# Patient Record
Sex: Female | Born: 1990 | Race: White | Hispanic: No | Marital: Married | Smoking: Never smoker
Health system: Southern US, Community
[De-identification: ages and names within clinical notes are randomized; demographics above are authoritative.]

## PROBLEM LIST (undated history)

## (undated) HISTORY — PX: WISDOM TOOTH EXTRACTION: SHX21

---

## 2016-09-11 ENCOUNTER — Inpatient Hospital Stay (HOSPITAL_COMMUNITY)
Admission: AD | Admit: 2016-09-11 | Discharge: 2016-09-11 | Disposition: A | Discharge status: Home / Self Care | Payer: Self-pay | Source: Ambulatory Visit | Attending: Obstetrics and Gynecology | Admitting: Obstetrics and Gynecology

## 2016-09-11 DIAGNOSIS — N912 Amenorrhea, unspecified: Secondary | ICD-10-CM | POA: Insufficient documentation

## 2016-09-11 DIAGNOSIS — Z3202 Encounter for pregnancy test, result negative: Secondary | ICD-10-CM | POA: Insufficient documentation

## 2016-09-11 DIAGNOSIS — N911 Secondary amenorrhea: Secondary | ICD-10-CM

## 2016-09-11 LAB — POCT PREGNANCY, URINE: PREG TEST UR: NEGATIVE

## 2016-09-11 LAB — URINALYSIS, ROUTINE W REFLEX MICROSCOPIC
Bilirubin Urine: NEGATIVE
Glucose, UA: NEGATIVE mg/dL
HGB URINE DIPSTICK: NEGATIVE
KETONES UR: NEGATIVE mg/dL
Leukocytes, UA: NEGATIVE
NITRITE: NEGATIVE
PROTEIN: NEGATIVE mg/dL
Specific Gravity, Urine: 1.014 (ref 1.005–1.030)
pH: 7 (ref 5.0–8.0)

## 2016-09-11 NOTE — Discharge Instructions (Signed)
Secondary Amenorrhea Secondary amenorrhea is the stopping of menstrual flow for 3-6 months in a female who has previously had periods. There are many possible causes. Most of these causes are not serious. Usually, treating the underlying problem causing the loss of menses will return your periods to normal. What are the causes? Some common and uncommon causes of not menstruating include:  Malnutrition.  Low blood sugar (hypoglycemia).  Polycystic ovary disease.  Stress or fear.  Breastfeeding.  Hormone imbalance.  Ovarian failure.  Medicines.  Extreme obesity.  Cystic fibrosis.  Low body weight or drastic weight reduction from any cause.  Early menopause.  Removal of ovaries or uterus.  Contraceptives.  Illness.  Long-term (chronic) illnesses.  Cushing syndrome.  Thyroid problems.  Birth control pills, patches, or vaginal rings for birth control.  What increases the risk? You may be at greater risk of secondary amenorrhea if:  You have a family history of this condition.  You have an eating disorder.  You do athletic training.  How is this diagnosed? A diagnosis is made by your health care provider taking a medical history and doing a physical exam. This will include a pelvic exam to check for problems with your reproductive organs. Pregnancy must be ruled out. Often, numerous blood tests are done to measure different hormones in the body. Urine testing may be done. Specialized exams (ultrasound, CT scan, MRI, or hysteroscopy) may have to be done as well as measuring the body mass index (BMI). How is this treated? Treatment depends on the cause of the amenorrhea. If an eating disorder is present, this can be treated with an adequate diet and therapy. Chronic illnesses may improve with treatment of the illness. Amenorrhea may be corrected with medicines, lifestyle changes, or surgery. If the amenorrhea cannot be corrected, it is sometimes possible to create a  false menstruation with medicines. Follow these instructions at home:  Maintain a healthy diet.  Manage weight problems.  Exercise regularly but not excessively.  Get adequate sleep.  Manage stress.  Be aware of changes in your menstrual cycle. Keep a record of when your periods occur. Note the date your period starts, how long it lasts, and any problems. Contact a health care provider if: Your symptoms do not get better with treatment. This information is not intended to replace advice given to you by your health care provider. Make sure you discuss any questions you have with your health care provider. Document Released: 02/23/2006 Document Revised: 06/20/2015 Document Reviewed: 06/30/2012 Elsevier Interactive Patient Education  2018 Elsevier Inc.  

## 2016-09-11 NOTE — MAU Provider Note (Signed)
  History     CSN: [id number]  Arrival date and time: 09/11/16 1359   None     No chief complaint on file.  HPI 26 yo G0 with LMP 04/2016 presenting today for evaluation of amenorrhea. Patient was seen at pregnancy care center today for pregnancy test and ultrasound. Pregnancy test was found to be negative. Patient denies any pelvic/abdominal pain. She denies any abnormal discharge. Patient denies any abnormal bleeding  OB History    No data available      No past medical history on file.  No past surgical history on file.  No family history on file.  Social History  Substance Use Topics  . Smoking status: Not on file  . Smokeless tobacco: Not on file  . Alcohol use Not on file    Allergies: Allergies not on file  No prescriptions prior to admission.    Review of Systems  See pertinent in HPI Physical Exam   Blood pressure 125/74, pulse 80, temperature 98.4 F (36.9 C), resp. rate 18, height 5\' 4"  (1.626 m), weight 209 lb (94.8 kg), last menstrual period 04/30/2016.  Physical Exam GENERAL: Well-developed, well-nourished female in no acute distress.  ABDOMEN: Soft, nontender, nondistended.  PELVIC: Not indicated EXTREMITIES: No cyanosis, clubbing, or edema, 2+ distal pulses.  MAU Course  Procedures  MDM UPT- negative  Assessment and Plan  26 yo G0 with amnorrhea - Patient informed of negative UPT - Discussed different etiology for amenorrhea and need for outpatient evaluation - Outpatient ultrasound ordered - patient to follow up at [facility] for results and further management   Peggy Hartman 09/11/2016, 2:22 PM

## 2016-09-11 NOTE — MAU Note (Signed)
Pt presents to MAU after going to pregnancy care center today stating that she had her last menstrual cycle in April and assumed she was approximately [redacted] weeks pregnant and was told today her pregnancy test was negative. Pt denies any pain or VB

## 2016-09-15 ENCOUNTER — Telehealth: Payer: Self-pay | Admitting: General Practice

## 2016-09-15 NOTE — Telephone Encounter (Signed)
Called and notified patient of appointment with Dr. Jolayne Panther on 09/21/16 at 3:20pm.  Patient voiced understanding.

## 2016-09-18 ENCOUNTER — Encounter (HOSPITAL_COMMUNITY): Payer: Self-pay

## 2016-09-18 ENCOUNTER — Inpatient Hospital Stay (HOSPITAL_COMMUNITY)
Admission: AD | Admit: 2016-09-18 | Discharge: 2016-09-18 | Disposition: A | Discharge status: Home / Self Care | Payer: Self-pay | Source: Ambulatory Visit | Attending: Family Medicine | Admitting: Family Medicine

## 2016-09-18 ENCOUNTER — Ambulatory Visit (HOSPITAL_COMMUNITY): Admission: RE | Admit: 2016-09-18 | Payer: Self-pay | Source: Ambulatory Visit

## 2016-09-18 ENCOUNTER — Inpatient Hospital Stay (HOSPITAL_COMMUNITY): Payer: Self-pay

## 2016-09-18 DIAGNOSIS — R112 Nausea with vomiting, unspecified: Secondary | ICD-10-CM

## 2016-09-18 DIAGNOSIS — R102 Pelvic and perineal pain unspecified side: Secondary | ICD-10-CM

## 2016-09-18 DIAGNOSIS — Z880 Allergy status to penicillin: Secondary | ICD-10-CM | POA: Insufficient documentation

## 2016-09-18 DIAGNOSIS — Z885 Allergy status to narcotic agent status: Secondary | ICD-10-CM | POA: Insufficient documentation

## 2016-09-18 DIAGNOSIS — Z3202 Encounter for pregnancy test, result negative: Secondary | ICD-10-CM

## 2016-09-18 DIAGNOSIS — N911 Secondary amenorrhea: Secondary | ICD-10-CM

## 2016-09-18 DIAGNOSIS — R109 Unspecified abdominal pain: Secondary | ICD-10-CM

## 2016-09-18 DIAGNOSIS — Z91010 Allergy to peanuts: Secondary | ICD-10-CM | POA: Insufficient documentation

## 2016-09-18 DIAGNOSIS — Z91018 Allergy to other foods: Secondary | ICD-10-CM | POA: Insufficient documentation

## 2016-09-18 LAB — CBC
HCT: 38.5 % (ref 36.0–46.0)
HEMOGLOBIN: 12.9 g/dL (ref 12.0–15.0)
MCH: 28.4 pg (ref 26.0–34.0)
MCHC: 33.5 g/dL (ref 30.0–36.0)
MCV: 84.8 fL (ref 78.0–100.0)
PLATELETS: 223 10*3/uL (ref 150–400)
RBC: 4.54 MIL/uL (ref 3.87–5.11)
RDW: 12.9 % (ref 11.5–15.5)
WBC: 5.8 10*3/uL (ref 4.0–10.5)

## 2016-09-18 LAB — COMPREHENSIVE METABOLIC PANEL
ALBUMIN: 3.9 g/dL (ref 3.5–5.0)
ALT: 22 U/L (ref 14–54)
AST: 20 U/L (ref 15–41)
Alkaline Phosphatase: 38 U/L (ref 38–126)
Anion gap: 9 (ref 5–15)
BUN: 9 mg/dL (ref 6–20)
CHLORIDE: 104 mmol/L (ref 101–111)
CO2: 25 mmol/L (ref 22–32)
CREATININE: 0.65 mg/dL (ref 0.44–1.00)
Calcium: 8.8 mg/dL — ABNORMAL LOW (ref 8.9–10.3)
GFR calc Af Amer: >60 mL/min (ref >60)
GFR calc non Af Amer: >60 mL/min (ref >60)
GLUCOSE: 109 mg/dL — AB (ref 65–99)
Potassium: 3.5 mmol/L (ref 3.5–5.1)
SODIUM: 138 mmol/L (ref 135–145)
Total Bilirubin: 0.5 mg/dL (ref 0.3–1.2)
Total Protein: 7.4 g/dL (ref 6.5–8.1)

## 2016-09-18 LAB — URINALYSIS, ROUTINE W REFLEX MICROSCOPIC
BILIRUBIN URINE: NEGATIVE
Glucose, UA: NEGATIVE mg/dL
Hgb urine dipstick: NEGATIVE
KETONES UR: NEGATIVE mg/dL
LEUKOCYTES UA: NEGATIVE
Nitrite: NEGATIVE
Protein, ur: NEGATIVE mg/dL
Specific Gravity, Urine: 1.002 — ABNORMAL LOW (ref 1.005–1.030)
pH: 7 (ref 5.0–8.0)

## 2016-09-18 LAB — TSH: TSH: 2.537 u[IU]/mL (ref 0.350–4.500)

## 2016-09-18 LAB — HCG, SERUM, QUALITATIVE: Preg, Serum: NEGATIVE

## 2016-09-18 MED ORDER — METOCLOPRAMIDE HCL 10 MG PO TABS: 5.0000 mg | ORAL_TABLET | Freq: Three times a day (TID) | ORAL | 2 refills | Status: AC

## 2016-09-18 MED ORDER — OMEPRAZOLE 20 MG PO CPDR: 20.0000 mg | DELAYED_RELEASE_CAPSULE | Freq: Every day | ORAL | 5 refills | Status: AC

## 2016-09-18 MED ORDER — MEDROXYPROGESTERONE ACETATE 10 MG PO TABS: 10.0000 mg | ORAL_TABLET | Freq: Every day | ORAL | 2 refills | Status: AC

## 2016-09-18 NOTE — MAU Note (Signed)
Pt presents to MAU stating that she was scheduled for any U/S after being evaluated last week for LMP of 04/05/1/. PT states she was suppose to follow up with Dr Jolayne Panther on Monday August the 27th for ultrasound results. PT states she went to U/S today and was told her U/S was cancelled

## 2016-09-18 NOTE — Discharge Instructions (Signed)
[place] Resource Guide °(Revised August 2014) ° ° °Chronic Pain Problems:  °• [facility]:  [phone]  °         Patients need to be referred by their primary care doctor/specialist ° °Insufficient Money for Medicine:  °·         United Way: call "211"   °• MAP Program at [facility] [place] 641-8030 or [place] 641-7620 °          ° °No Primary Care Doctor:  °To locate a primary care doctor that accepts your insurance or provides certain services:  °·         [facility]: 832-8000  °·         Physician Referral Service: 1-800-533-3463 ask for “My [place]” °• If no insurance, you need to see if you qualify for GCCN “orange card”, call to set      up appointment for eligibility/enrollment at [phone] or [phone] or visit [place] [facility] ([address], [place] and [address] -[place]) to meet with a GCCN enrollment specialist. ° °Agencies that provide inexpensive (sliding fee scale) medical care:  ° °•    [facility] - [facility] at [place] - [phone] °•    [facility]  -  [facility] - [phone] °•    [facility] - 336-832-7272 °•    [facility] - 336-832-4444 °•    [facility] - 336-832-3150 °•    [facility] - 336-832-8035 °• [facility] - [facility] [place] - 336-272-     1050 °• [facility] - [facility] [place] - 336-370-9091 °• [facility]: 336-832-8035  °• [facility]: 336-832-4777  °• [facility]: 336-373-0678  °• [facility] - 336- 387-6161 ° ° ° °Medicaid-accepting [place] Providers:  °·         [facility] - 641-2100 (No Family Planning accepted) °·         [address], 641-2100, Mon-Fri 9am-5pm °·          [facility] - [phone] °• [address], [address], Mon-Thursday 8am-5pm, Fri 8am-noon °• [facility] - [phone] °·         [address], [address], Mon-Fri 7:30am-4:30pm °·         [facility] - 299-7000 °·         [address] [place] [address], Mon-Fri 8am-5pm  °·        [facility] - [phone] °·         [address] °·         Only accepts [place] Access Medicaid patients after they have their name applied to their card ° °Self Pay (no insurance) in [place]:  °         Sickle Cell Patients:  °• [address], (336) 832-1970 °[facility]: °• [address], [place] (336) 832-7272 ° °     [facility] °• [address], [place] (336) 832-4444 ° ° Family Practice: °• [address], (336) [phone] °           [facility]  °·         [address], (336) 832-4400 °[facility] °• [address], (336) [phone] °          [facility]  °·         [address], [place] [phone] °       [facility] - [address]  °·         641-2100, Mon-Fri 9am-7pm, Sat 9am-1pm °         [facility] [place] °·         [address] [place] [address], [phone] °         [facility] [place]  °·         [address], [phone] °[facility] - [facility] [place] °• [address], [place] (336) 884-0224 °         [facility]  °·         [address] [place] [address], [phone] ° [facility]  °• [address], (336) 272-1050 °[facility] °• [address], (336) 370-9091 ° Dr. Osei-Bonsu  °·         [address], [place],  [phone] °         [facility]  °·         [address], [phone] °         [facility]  °·           [address], [phone] °         [facility]  °·         [address], [phone], 1st & 3rd Saturday every month, 10am-1pm ° °OTHERS:  [facility]  ([facility] Only)  (336) [phone] (Thursday only) ° °Strategies for finding a Primary Care Provider:  °1) Find a Doctor and Pay Out of Pocket  °Although you won't have to find out who is covered by your insurance plan, it is a good idea to ask around and get recommendations. You will then need to call the office and see if the doctor you have chosen will accept you as a new patient and what types of options they offer for patients who are self-pay. Some doctors offer discounts or will set up payment plans for their patients who do not have insurance, but you will need to ask so you aren't surprised when you get to your appointment.  °2) Contact [facility] - To see if you qualify for “orange card” access to healthcare safety net providers.  Call for appointment for eligibility/enrollment at [phone] or [phone]- [phone]. (Uninsured, 0-200% FPL, qualifying info)  °Applicants for GCCN are first required to see if they are eligible to enroll in the ACA Marketplace before enrolling in GCCN (and get an exemption if they are not).  °GCCN Criteria for acceptance is:  °? Proof of ACA Marketing exemption - form or documentation  °? Valid photo ID (driver's license, state identification card, passport, home country ID)  °? Proof of [place] residency (e.g. driver’s license, lease/landlord information, pay stubs with address, utility   bill, bank statement, etc.)  °? Proof of income (1040, last year's tax return, W2, 4 current pay stubs, other income proof)  °? Proof of assets (current bank statement + 3 most recent, disability paperwork, life insurance info, tax value on autos, etc.) ° °3)  Contact Your Local Health Department  °Not all health departments have doctors that can see patients for sick visits, but many do, so it is worth a call to see if yours does. If you don't know where your local health department is, you can check in your phone book. The CDC also has a tool to help you locate your state's health department, and many state websites also have listings of all of their local health departments.  °4) Find a Walk-in Clinic  °If your illness is not likely to be very severe or complicated, you may want to try a walk in clinic. These are popping up all over the country in pharmacies, drugstores, and shopping centers. They're usually staffed by nurse practitioners or physician assistants that have been trained to treat common illnesses and complaints. They're usually fairly quick and inexpensive. However, if you have serious medical issues or chronic medical problems, these are probably not your best option  ° °STD Testing:  °·         [place] [facility] [place], STD Clinic  °·         [address], [place], phone [phone] or [phone]  °·         Monday - Friday, call for an appointment °·         [facility] [place], STD Clinic  °·         [address], [place], phone [phone] or [phone]  °·         Monday - Friday, call for an appointment °Abuse/Neglect:  °·         [place] Child Abuse Hotline: 641-3795  °·         [place] Child Abuse Hotline: 800-378-5315 (After Hours) ° °Emergency Shelter:  °[facility] (336) 271-5959 ° [facility] [place]- (336) 881-5415 ° [facility] [place] - (336) 235-0330 ° [facility] - (336) 375-1332 (ages 11-17) ° Homeless Day Shelter @ [facility] - (336) 332-0824 °  °Mammograms - Free at [facility] - [place] - 614-3233 ° °Maternity Homes:  °·         [facility]: (336) 275-9566   (Homeless mother with  children) °·         [facility]: (704) 372-4663 (Mothers only) °• [facility]: (336) 370-9232 (Pregnant 16-21 years old) °• [facility] -(336) 641-7777 ° °[place] [facility]  °• [facility] °• [address], [place] (336) 355-9701 °·         [facility] [place]  °·         [address], [place]  °·         349-3220 °·         [employer]  °·         [address], [place]  °·         342-7768 °·         [facility]  °·         [address], [place]  °·         342-8140 °·         [facility]  °·         342-8316 °·         [place] [facility] - [facility]  °·         1-888-581-9988 °·         [facility] in [place]  °·         [address]  °·         336-349-4454, Insurance °·         [place] Child Abuse Hotline  °·         (336) 342-1394  °·         (336) 342-3537 (After Hours) ° °Behavioral Health Services /Substance Abuse Resources:  °·         [facility]: 882-2125  °·         [facility]: 784-9470  °·        [facility] [place] [facility]: (336) 285-9073  °• Narcotics Helpline - 1-866-375-1272 °·         [facility]: (336) 845-3988  °·         [facility] Outpatient Substance Abuse [facility] - [facility]: 800-659-3381 °• [facility] - (336) 285-2605 °Psychological Services: °·         [facility]: 832-9600  °• [facility]: 1-877-626-1772 °·         [facility]  °·         [address] [place] [address], [place]  °·         ACCESS LINE: 1-800-256-2452     (24 Hour) °• Mobile Crisis:  °• HELPLINES:  °[facility] [place] (336) 370-4264 °[facility] - [place] (800) 451-9682 °• Walk In Crisis Services °      [facility] - [address] [place] [address] - [place]  (336)676-6905 °       [facility] - (336)832-9600 or (336) 832-9700 ° [facility] - [address] - [place] (336)899-1505 ° [facility] [place] [facility] - [address], [place] (336) 878-6098 ° ° °Dental Assistance:  °If unable to pay or uninsured, contact: [place] [facility] to become qualified for the adult dental clinic. Patient must be enrolled in GCCN (uninsured, 0-200% FPL, qualifying info).  Enroll in GCCN first, then see Primary Care Physician assigned to you, the PCP makes a dental referral. [facility] will receive referral and contacts patient for appointment. ° °Patients with Medicaid  °         [address], 510-2600 ° [facility]Children up to 20 + Pregnant Women) - (336) 641-3152 ° [facility] - [address] - [address] (336) 235-2808 ° °If unable to pay, or uninsured: contact [place] [facility] (641-3152 in [place] - (Children only + Pregnant Women), 641-7733 in [place]- Children only) to become qualified for the adult dental clinic  °Must see if eligible to enroll in ACA Health Insurance Marketplace before enrolling into the GCCN (exemption required) (1-855-733-3711 for an appointment)  www.healthcare.gov;   1-800-318-2596.  If not eligible for ACA, then go by Department of Health and Human Services to see if eligible for “orange card.”    [address], [place] and [address]- [place].  Once you get an orange card, you will have a Primary Care home who will then refer you to dental if needed. ° ° ° ° ° ° ° °Other Low-Cost Community Dental Services:  ° [facility] - 334-4822 (ext 50251) °  [address] [place] [address] ° Dr. Civils - 272-4177 °  [address] °  ° [facility] - 734-7550 °  [address] ° °         [facility]  °         [address], [place], [place], [address]  °         723-1848, Ext. 123  °         2nd and 4th Thursday of the month at 6:30am (Simple  extractions only - no wisdom teeth or surgery) First come/First serve -First 10 clients served ° °         [facility] ([place] and [place] residents only) °         [address], [place], [place], [address]  °         723-7904 °          °         [facility]  °         342-8273 °         [place] [facility]  °        703-3100 °         [facility] °         570-6415 °  °Transportation Options: ° Ambulance - 911 - $250-$700 per ride °Family Member to accompany patient (if stable) - [place] [employer] - (336) 355-6499 ° PART - (336) 662-0002 ° Taxi - (336) 272-5112 - [place] ° SCAT - (336) 333-6589 (Application required) ° [place] [facility] - (336) 641-4848 °  ° °

## 2016-09-18 NOTE — MAU Provider Note (Signed)
Chief Complaint: Follow-up   First Provider Initiated Contact with Patient 09/18/16 0847      SUBJECTIVE HPI: Peggy Hartman is a 26 y.o. G1P0010 who presents to maternity admissions reporting no menses x 4 months, nausea and vomiting, and abdominal bloating/enlargement x 3 months. She is tearful, reporting the nausea is so severe that she cannot work and may lose her job.  She reports some mild abdominal cramping that has not required any treatment. There are no associated symptoms.  She denies vaginal bleeding, vaginal itching/burning, urinary symptoms, h/a, dizziness, n/v, or fever/chills.     HPI  No past medical history on file. Past Surgical History:  Procedure Laterality Date  . WISDOM TOOTH EXTRACTION     Social History   Social History  . Marital status: Married    Spouse name: N/A  . Number of children: N/A  . Years of education: N/A   Occupational History  . Not on file.   Social History Main Topics  . Smoking status: Never Smoker  . Smokeless tobacco: Never Used  . Alcohol use No  . Drug use: No  . Sexual activity: Yes   Other Topics Concern  . Not on file   Social History Narrative  . No narrative on file   No current facility-administered medications on file prior to encounter.    No current outpatient prescriptions on file prior to encounter.   Allergies  Allergen Reactions  . Other Anaphylaxis    Peanuts, tree nuts, coconut, tropical fruits, bananas  . Penicillins Hives    Has patient had a PCN reaction causing immediate rash, facial/tongue/throat swelling, SOB or lightheadedness with hypotension: Yes Has patient had a PCN reaction causing severe rash involving mucus membranes or skin necrosis: No Has patient had a PCN reaction that required hospitalization: No Has patient had a PCN reaction occurring within the last 10 years: Yes If all of the above answers are "NO", then may proceed with Cephalosporin use.   Marland Kitchen Percocet  [Oxycodone-Acetaminophen] Hives    ROS:  Review of Systems  Constitutional: Negative for chills, fatigue and fever.  Respiratory: Negative for shortness of breath.   Cardiovascular: Negative for chest pain.  Gastrointestinal: Positive for nausea and vomiting. Negative for constipation.  Genitourinary: Positive for pelvic pain. Negative for difficulty urinating, dysuria, flank pain, vaginal bleeding, vaginal discharge and vaginal pain.  Neurological: Negative for dizziness and headaches.  Psychiatric/Behavioral: Negative.      I have reviewed patient's Past Medical Hx, Surgical Hx, Family Hx, Social Hx, medications and allergies.   Physical Exam  Patient Vitals for the past 24 hrs:  BP Temp Pulse Resp  09/18/16 0831 (!) 142/66 98.2 F (36.8 C) 61 18   Constitutional: Well-developed, well-nourished female in no acute distress.  Cardiovascular: normal rate Respiratory: normal effort GI: Abd soft, non-tender. Pos BS x 4 MS: Extremities nontender, no edema, normal ROM Neurologic: Alert and oriented x 4.  GU: Neg CVAT.  PELVIC EXAM: Deferred   LAB RESULTS Results for orders placed or performed during the hospital encounter of 09/18/16 (from the past 24 hour(s))  Urinalysis, Routine w reflex microscopic     Status: Abnormal   Collection Time: 09/18/16  8:50 AM  Result Value Ref Range   Color, Urine STRAW (A) YELLOW   APPearance CLEAR CLEAR   Specific Gravity, Urine 1.002 (L) 1.005 - 1.030   pH 7.0 5.0 - 8.0   Glucose, UA NEGATIVE NEGATIVE mg/dL   Hgb urine dipstick NEGATIVE NEGATIVE  Bilirubin Urine NEGATIVE NEGATIVE   Ketones, ur NEGATIVE NEGATIVE mg/dL   Protein, ur NEGATIVE NEGATIVE mg/dL   Nitrite NEGATIVE NEGATIVE   Leukocytes, UA NEGATIVE NEGATIVE  CBC     Status: None   Collection Time: 09/18/16  9:18 AM  Result Value Ref Range   WBC 5.8 4.0 - 10.5 K/uL   RBC 4.54 3.87 - 5.11 MIL/uL   Hemoglobin 12.9 12.0 - 15.0 g/dL   HCT 16.1 09.6 - 04.5 %   MCV 84.8 78.0 -  100.0 fL   MCH 28.4 26.0 - 34.0 pg   MCHC 33.5 30.0 - 36.0 g/dL   RDW 40.9 81.1 - 91.4 %   Platelets 223 150 - 400 K/uL  Comprehensive metabolic panel     Status: Abnormal   Collection Time: 09/18/16  9:18 AM  Result Value Ref Range   Sodium 138 135 - 145 mmol/L   Potassium 3.5 3.5 - 5.1 mmol/L   Chloride 104 101 - 111 mmol/L   CO2 25 22 - 32 mmol/L   Glucose, Bld 109 (H) 65 - 99 mg/dL   BUN 9 6 - 20 mg/dL   Creatinine, Ser 7.82 0.44 - 1.00 mg/dL   Calcium 8.8 (L) 8.9 - 10.3 mg/dL   Total Protein 7.4 6.5 - 8.1 g/dL   Albumin 3.9 3.5 - 5.0 g/dL   AST 20 15 - 41 U/L   ALT 22 14 - 54 U/L   Alkaline Phosphatase 38 38 - 126 U/L   Total Bilirubin 0.5 0.3 - 1.2 mg/dL   GFR calc non Af Amer >60 >60 mL/min   GFR calc Af Amer >60 >60 mL/min   Anion gap 9 5 - 15  hCG, serum, qualitative     Status: None   Collection Time: 09/18/16  9:18 AM  Result Value Ref Range   Preg, Serum NEGATIVE NEGATIVE  TSH     Status: None   Collection Time: 09/18/16  9:18 AM  Result Value Ref Range   TSH 2.537 0.350 - 4.500 uIU/mL       IMAGING US Transvaginal Non-ob  Result Date: 09/18/2016 CLINICAL DATA:  Amenorrhea. EXAM: TRANSABDOMINAL AND TRANSVAGINAL ULTRASOUND OF PELVIS TECHNIQUE: Both transabdominal and transvaginal ultrasound examinations of the pelvis were performed. Transabdominal technique was performed for global imaging of the pelvis including uterus, ovaries, adnexal regions, and pelvic cul-de-sac. It was necessary to proceed with endovaginal exam following the transabdominal exam to visualize the endometrium. COMPARISON:  None FINDINGS: Uterus Measurements: 8 x 4.0 x 5.1 cm. No fibroids or other mass visualized. Endometrium Thickness: 7.6 mm.  No focal abnormality visualized. Right ovary Measurements: 2.3 x 1.8 x 2.0 cm. Normal appearance/no adnexal mass. Left ovary Measurements: 3.8 x 2.2 x 2.4 cm. Corpus luteal cysts noted. Other findings No abnormal free fluid. IMPRESSION: 1. Normal  physiologic appearance of the uterus and adnexal structures. Electronically Signed   By: Signa Kell M.D.   On: 09/18/2016 11:22   US Pelvis Complete  Result Date: 09/18/2016 CLINICAL DATA:  Amenorrhea. EXAM: TRANSABDOMINAL AND TRANSVAGINAL ULTRASOUND OF PELVIS TECHNIQUE: Both transabdominal and transvaginal ultrasound examinations of the pelvis were performed. Transabdominal technique was performed for global imaging of the pelvis including uterus, ovaries, adnexal regions, and pelvic cul-de-sac. It was necessary to proceed with endovaginal exam following the transabdominal exam to visualize the endometrium. COMPARISON:  None FINDINGS: Uterus Measurements: 8 x 4.0 x 5.1 cm. No fibroids or other mass visualized. Endometrium Thickness: 7.6 mm.  No focal abnormality visualized. Right  ovary Measurements: 2.3 x 1.8 x 2.0 cm. Normal appearance/no adnexal mass. Left ovary Measurements: 3.8 x 2.2 x 2.4 cm. Corpus luteal cysts noted. Other findings No abnormal free fluid. IMPRESSION: 1. Normal physiologic appearance of the uterus and adnexal structures. Electronically Signed   By: Signa Kell M.D.   On: 09/18/2016 11:22    MAU Management/MDM: Ordered labs and reviewed results.  Reviewed negative pregnancy test and normal ultrasound with pt.  Pt is to follow up with primary care if n/v continues.   May follow up in [facility] office for secondary amenorrhea as desired. Will prescribe Provera x 10 days for progesterone challenge.  Treat n/v with reglan so pt can go to work.  Return to MAU with emergencies.  Pt stable at time of discharge.  ASSESSMENT 1. Abdominal pain in female patient   2. Pelvic pain in female   3. Nausea and vomiting in adult   4. Pregnancy examination or test, negative result   5. Secondary amenorrhea     PLAN Discharge home  Allergies as of 09/18/2016      Reactions   Other Anaphylaxis   Peanuts, tree nuts, coconut, tropical fruits, bananas   Penicillins Hives   Has patient had  a PCN reaction causing immediate rash, facial/tongue/throat swelling, SOB or lightheadedness with hypotension: Yes Has patient had a PCN reaction causing severe rash involving mucus membranes or skin necrosis: No Has patient had a PCN reaction that required hospitalization: No Has patient had a PCN reaction occurring within the last 10 years: Yes If all of the above answers are "NO", then may proceed with Cephalosporin use.   Percocet [oxycodone-acetaminophen] Hives      Medication List    TAKE these medications   medroxyPROGESTERone 10 MG tablet Commonly known as:  PROVERA Take 1 tablet (10 mg total) by mouth daily. Use for ten days   metoCLOPramide 10 MG tablet Commonly known as:  REGLAN Take 0.5-1 tablets (5-10 mg total) by mouth 3 (three) times daily before meals.   omeprazole 20 MG capsule Commonly known as:  PRILOSEC Take 1 capsule (20 mg total) by mouth daily.   prenatal multivitamin Tabs tablet Take 1 tablet by mouth daily at 12 noon.            Discharge Care Instructions        Start     Ordered   09/18/16 0000  metoCLOPramide (REGLAN) 10 MG tablet  3 times daily before meals    Question:  Supervising Provider  Answer:  Reva Bores   09/18/16 1155   09/18/16 0000  Discharge patient    Question Answer Comment  Discharge disposition 01-Home or Self Care   Discharge patient date 09/18/2016      09/18/16 1155   09/18/16 0000  omeprazole (PRILOSEC) 20 MG capsule  Daily    Question:  Supervising Provider  Answer:  Reva Bores   09/18/16 1229   09/18/16 0000  medroxyPROGESTERone (PROVERA) 10 MG tablet  Daily    Question:  Supervising Provider  Answer:  Reva Bores   09/18/16 1229     Follow-up Information    Primary care provider of your choice Follow up.   Why:  See resources provided          Sharen Counter Certified Nurse-Midwife 09/18/2016  12:30 PM

## 2016-09-21 ENCOUNTER — Encounter: Payer: Self-pay | Admitting: Obstetrics and Gynecology

## 2017-11-06 ENCOUNTER — Emergency Department (HOSPITAL_BASED_OUTPATIENT_CLINIC_OR_DEPARTMENT_OTHER): Payer: No Typology Code available for payment source

## 2017-11-06 ENCOUNTER — Other Ambulatory Visit: Payer: Self-pay

## 2017-11-06 ENCOUNTER — Emergency Department (HOSPITAL_BASED_OUTPATIENT_CLINIC_OR_DEPARTMENT_OTHER)
Admission: EM | Admit: 2017-11-06 | Discharge: 2017-11-06 | Disposition: A | Discharge status: Home / Self Care | Payer: No Typology Code available for payment source | Attending: Emergency Medicine | Admitting: Emergency Medicine

## 2017-11-06 ENCOUNTER — Encounter (HOSPITAL_BASED_OUTPATIENT_CLINIC_OR_DEPARTMENT_OTHER): Payer: Self-pay | Admitting: *Deleted

## 2017-11-06 DIAGNOSIS — R51 Headache: Secondary | ICD-10-CM | POA: Insufficient documentation

## 2017-11-06 DIAGNOSIS — S46912A Strain of unspecified muscle, fascia and tendon at shoulder and upper arm level, left arm, initial encounter: Secondary | ICD-10-CM | POA: Insufficient documentation

## 2017-11-06 DIAGNOSIS — Y929 Unspecified place or not applicable: Secondary | ICD-10-CM | POA: Insufficient documentation

## 2017-11-06 DIAGNOSIS — Z79899 Other long term (current) drug therapy: Secondary | ICD-10-CM | POA: Diagnosis not present

## 2017-11-06 DIAGNOSIS — M79604 Pain in right leg: Secondary | ICD-10-CM | POA: Insufficient documentation

## 2017-11-06 DIAGNOSIS — F0781 Postconcussional syndrome: Secondary | ICD-10-CM | POA: Insufficient documentation

## 2017-11-06 DIAGNOSIS — Y939 Activity, unspecified: Secondary | ICD-10-CM | POA: Insufficient documentation

## 2017-11-06 DIAGNOSIS — Y999 Unspecified external cause status: Secondary | ICD-10-CM | POA: Insufficient documentation

## 2017-11-06 DIAGNOSIS — S4992XA Unspecified injury of left shoulder and upper arm, initial encounter: Secondary | ICD-10-CM | POA: Diagnosis present

## 2017-11-06 MED ORDER — NAPROXEN 500 MG PO TABS: 500.0000 mg | ORAL_TABLET | Freq: Two times a day (BID) | ORAL | 0 refills | Status: AC
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
## 2017-11-06 NOTE — Discharge Instructions (Signed)
CT head without any significant brain injury but symptoms are consistent with a concussion.  If not improving over 2 weeks follow-up with neurology for further evaluation.  X-ray of the left shoulder without any bony injuries but always could have rotator cuff type injury.  If not improving at 2 weeks he can appointment to follow-up with sports medicine.  In the meantime take the Naprosyn as directed.  Return for any new or worse symptoms.
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
## 2017-11-06 NOTE — ED Notes (Signed)
ED Provider at bedside. 

## 2017-11-06 NOTE — ED Notes (Signed)
Patient transported to CT and XR 
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
